# Patient Record
Sex: Male | Born: 2005 | Race: Black or African American | Hispanic: No | Marital: Single | Smoking: Never smoker
Health system: Southern US, Community
[De-identification: ages and names within clinical notes are randomized; demographics above are authoritative.]

## PROBLEM LIST (undated history)

## (undated) HISTORY — PX: OTHER SURGICAL HISTORY: SHX169

---
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
## 2025-01-02 ENCOUNTER — Ambulatory Visit: Admitting: Pediatrics
